# Patient Record
Sex: Female | Born: 1995 | Hispanic: No | Marital: Single | Smoking: Never smoker
Health system: Southern US, Community
[De-identification: ages and names within clinical notes are randomized; demographics above are authoritative.]

## PROBLEM LIST (undated history)

## (undated) HISTORY — PX: LIPOSUCTION: SHX10

---

## 2018-05-02 ENCOUNTER — Encounter (HOSPITAL_BASED_OUTPATIENT_CLINIC_OR_DEPARTMENT_OTHER): Payer: Self-pay | Admitting: Emergency Medicine

## 2018-05-02 ENCOUNTER — Emergency Department (HOSPITAL_BASED_OUTPATIENT_CLINIC_OR_DEPARTMENT_OTHER)
Admission: EM | Admit: 2018-05-02 | Discharge: 2018-05-02 | Disposition: A | Discharge status: Home / Self Care | Payer: PRIVATE HEALTH INSURANCE | Attending: Emergency Medicine | Admitting: Emergency Medicine

## 2018-05-02 ENCOUNTER — Emergency Department (HOSPITAL_BASED_OUTPATIENT_CLINIC_OR_DEPARTMENT_OTHER): Payer: PRIVATE HEALTH INSURANCE

## 2018-05-02 ENCOUNTER — Other Ambulatory Visit: Payer: Self-pay

## 2018-05-02 DIAGNOSIS — R05 Cough: Secondary | ICD-10-CM

## 2018-05-02 DIAGNOSIS — R059 Cough, unspecified: Secondary | ICD-10-CM

## 2018-05-02 DIAGNOSIS — J069 Acute upper respiratory infection, unspecified: Secondary | ICD-10-CM | POA: Diagnosis not present

## 2018-05-02 MED ORDER — BENZONATATE 100 MG PO CAPS: 100.0000 mg | ORAL_CAPSULE | Freq: Three times a day (TID) | ORAL | 0 refills | Status: AC

## 2018-05-02 NOTE — ED Triage Notes (Signed)
Pt having sour throat, persistent cough with greenish secretion for the past 5 days.

## 2018-05-02 NOTE — ED Provider Notes (Signed)
Emergency Department Provider Note   I have reviewed the triage vital signs and the nursing notes.   HISTORY  Chief Complaint URI   HPI Renee Valencia is a 23 y.o. female with PMH of soar throat and productive cough over the past 5 days.  Patient has not experienced fever.  No shaking chills.  No known exposure to COVID-19. No travel outside the immediate area or to high risk locations.  She denies any abdominal or chest pain.  She works in close proximity with the public.  A family member was evaluated today and reportedly diagnosed with a bronchitis.  Patient does not smoke cigarettes. No radiation or modifying factors.    History reviewed. No pertinent past medical history.  There are no active problems to display for this patient.   Past Surgical History:  Procedure Laterality Date  . LIPOSUCTION      Allergies Patient has no known allergies.  History reviewed. No pertinent family history.  Social History Social History   Tobacco Use  . Smoking status: Never Smoker  . Smokeless tobacco: Never Used  Substance Use Topics  . Alcohol use: Never    Frequency: Never  . Drug use: Never    Review of Systems  Constitutional: No fever/chills Eyes: No visual changes. ENT: Positive sore throat. Cardiovascular: Denies chest pain. Respiratory: Denies shortness of breath. Positive cough.  Gastrointestinal: No abdominal pain.  No nausea, no vomiting.  No diarrhea.  No constipation. Genitourinary: Negative for dysuria. Musculoskeletal: Negative for back pain. Skin: Negative for rash. Neurological: Negative for headaches, focal weakness or numbness.  10-point ROS otherwise negative.  ____________________________________________   PHYSICAL EXAM:  VITAL SIGNS: ED Triage Vitals [05/02/18 2149]  Enc Vitals Group     BP 122/74     Pulse Rate 90     Resp 18     Temp 98.5 F (36.9 C)     Temp Source Oral     SpO2 98 %   Constitutional: Alert and oriented. Well  appearing and in no acute distress. Eyes: Conjunctivae are normal. Head: Atraumatic. Nose: No congestion/rhinnorhea. Mouth/Throat: Mucous membranes are moist. No oropharyngeal erythema or exudate. No PTA.  Neck: No stridor.   Cardiovascular: Normal rate, regular rhythm. Good peripheral circulation. Grossly normal heart sounds.   Respiratory: Normal respiratory effort.  No retractions. Lungs CTAB. Gastrointestinal: Soft and nontender. No distention.  Musculoskeletal: No lower extremity tenderness nor edema. No gross deformities of extremities. Neurologic:  Normal speech and language. No gross focal neurologic deficits are appreciated.  Skin:  Skin is warm, dry and intact. No rash noted.  ____________________________________________  RADIOLOGY  Dg Chest 2 View  Result Date: 05/02/2018 CLINICAL DATA:  Cough EXAM: CHEST - 2 VIEW COMPARISON:  None. FINDINGS: The heart size and mediastinal contours are within normal limits. Both lungs are clear. Mild scoliosis of the spine IMPRESSION: No active cardiopulmonary disease. Electronically Signed   By: Jasmine Pang M.D.   On: 05/02/2018 22:34    ____________________________________________   PROCEDURES  Procedure(s) performed:   Procedures  None  ____________________________________________   INITIAL IMPRESSION / ASSESSMENT AND PLAN / ED COURSE  Pertinent labs & imaging results that were available during my care of the patient were reviewed by me and considered in my medical decision making (see chart for details).  Patient presents to the emergency department for evaluation of cough and sore throat.  No fever.  Low risk for COVID-19.  Would not offer test at this time given low  exposure risk.  Patient is well-appearing, afebrile, not hypoxic.  With 5 days of symptoms I do plan for chest x-ray and reassess.   CXR without acute findings. Plan for discharge with plan for supportive care and home isolation until symptoms resolve. Patient  understanding isolation instruction.  ____________________________________________  FINAL CLINICAL IMPRESSION(S) / ED DIAGNOSES  Final diagnoses:  Viral upper respiratory tract infection  Cough     NEW OUTPATIENT MEDICATIONS STARTED DURING THIS VISIT:  Discharge Medication List as of 05/02/2018 10:51 PM    START taking these medications   Details  benzonatate (TESSALON) 100 MG capsule Take 1 capsule (100 mg total) by mouth every 8 (eight) hours., Starting Tue 05/02/2018, Print        Note:  This document was prepared using Dragon voice recognition software and may include unintentional dictation errors.  Renee Bene, MD Emergency Medicine    Elda Dunkerson, Arlyss Repress, MD 05/03/18 [phone]

## 2018-05-02 NOTE — ED Notes (Signed)
ED Provider at bedside. 

## 2018-05-02 NOTE — ED Notes (Signed)
Patient transported to X-ray 

## 2018-05-02 NOTE — Discharge Instructions (Signed)

## 2018-10-03 ENCOUNTER — Encounter (HOSPITAL_BASED_OUTPATIENT_CLINIC_OR_DEPARTMENT_OTHER): Payer: Self-pay

## 2018-10-03 ENCOUNTER — Emergency Department (HOSPITAL_BASED_OUTPATIENT_CLINIC_OR_DEPARTMENT_OTHER): Payer: PRIVATE HEALTH INSURANCE

## 2018-10-03 ENCOUNTER — Other Ambulatory Visit: Payer: Self-pay

## 2018-10-03 ENCOUNTER — Emergency Department (HOSPITAL_BASED_OUTPATIENT_CLINIC_OR_DEPARTMENT_OTHER)
Admission: EM | Admit: 2018-10-03 | Discharge: 2018-10-03 | Disposition: A | Discharge status: Home / Self Care | Payer: PRIVATE HEALTH INSURANCE | Attending: Emergency Medicine | Admitting: Emergency Medicine

## 2018-10-03 DIAGNOSIS — R1013 Epigastric pain: Secondary | ICD-10-CM | POA: Insufficient documentation

## 2018-10-03 DIAGNOSIS — R1011 Right upper quadrant pain: Secondary | ICD-10-CM | POA: Insufficient documentation

## 2018-10-03 LAB — COMPREHENSIVE METABOLIC PANEL
ALT: 15 U/L (ref 0–44)
AST: 17 U/L (ref 15–41)
Albumin: 4.5 g/dL (ref 3.5–5.0)
Alkaline Phosphatase: 72 U/L (ref 38–126)
Anion gap: 9 (ref 5–15)
BUN: 17 mg/dL (ref 6–20)
CO2: 27 mmol/L (ref 22–32)
Calcium: 9.4 mg/dL (ref 8.9–10.3)
Chloride: 104 mmol/L (ref 98–111)
Creatinine, Ser: 0.69 mg/dL (ref 0.44–1.00)
GFR calc Af Amer: >60 mL/min (ref >60)
GFR calc non Af Amer: >60 mL/min (ref >60)
Glucose, Bld: 81 mg/dL (ref 70–99)
Potassium: 3.6 mmol/L (ref 3.5–5.1)
Sodium: 140 mmol/L (ref 135–145)
Total Bilirubin: 0.4 mg/dL (ref 0.3–1.2)
Total Protein: 7.8 g/dL (ref 6.5–8.1)

## 2018-10-03 LAB — URINALYSIS, ROUTINE W REFLEX MICROSCOPIC
Bilirubin Urine: NEGATIVE
Glucose, UA: NEGATIVE mg/dL
Hgb urine dipstick: NEGATIVE
Ketones, ur: NEGATIVE mg/dL
Leukocytes,Ua: NEGATIVE
Nitrite: NEGATIVE
Protein, ur: NEGATIVE mg/dL
Specific Gravity, Urine: 1.025 (ref 1.005–1.030)
pH: 6 (ref 5.0–8.0)

## 2018-10-03 LAB — CBC
HCT: 40.5 % (ref 36.0–46.0)
Hemoglobin: 12.8 g/dL (ref 12.0–15.0)
MCH: 27.4 pg (ref 26.0–34.0)
MCHC: 31.6 g/dL (ref 30.0–36.0)
MCV: 86.7 fL (ref 80.0–100.0)
Platelets: 292 10*3/uL (ref 150–400)
RBC: 4.67 MIL/uL (ref 3.87–5.11)
RDW: 14.4 % (ref 11.5–15.5)
WBC: 9.9 10*3/uL (ref 4.0–10.5)
nRBC: 0 % (ref 0.0–0.2)

## 2018-10-03 LAB — PREGNANCY, URINE: Preg Test, Ur: NEGATIVE

## 2018-10-03 LAB — LIPASE, BLOOD: Lipase: 34 U/L (ref 11–51)

## 2018-10-03 MED ORDER — SODIUM CHLORIDE 0.9% FLUSH
3.0000 mL | Freq: Once | INTRAVENOUS | Status: DC
  Filled 2018-10-03: qty 3

## 2018-10-03 MED ORDER — OMEPRAZOLE 20 MG PO CPDR: 20.0000 mg | DELAYED_RELEASE_CAPSULE | Freq: Every day | ORAL | 0 refills | Status: AC

## 2018-10-03 NOTE — Discharge Instructions (Addendum)
You were seen in the emergency department for upper abdominal pain.  You had blood work and a right upper quadrant ultrasound that were unremarkable.  This is possibly related to some gastritis or an ulcer and we are starting you on some acid medication.  Please limit your coffee drinking and you can try Maalox in between meals and at bedtime.  Follow-up with your doctor or return if any worsening symptoms.

## 2018-10-03 NOTE — ED Provider Notes (Signed)
[facility] EMERGENCY DEPARTMENT Provider Note   CSN: [id number] Arrival date & time: 10/03/18  1429     History   Chief Complaint Chief Complaint  Patient presents with  . Abdominal Pain    HPI Renee Valencia is a 23 y.o. female.  She is complaining of 4 days of subxiphoid abdominal pain.  She seems to be worse with eating and improved with bowel rest.  Not associate with any nausea vomiting diarrhea or urinary symptoms.  She took some Tylenol with minimal relief.  She does not smoke she drinks only socially and does not take a lot of NSAIDs although she does drink a lot of coffee.  She said 8 years ago she was told she has an ulcer or gastritis.  History gathered via iPad interpreter Spanish-speaking.     The history is provided by the patient. The history is limited by a language barrier. A language interpreter was used.  Abdominal Pain Pain location:  Epigastric Pain quality: cramping   Pain radiates to:  Does not radiate Pain severity:  Moderate Onset quality:  Gradual Duration:  4 days Timing:  Intermittent Progression:  Worsening Chronicity:  New Context: eating   Context: not alcohol use, not diet changes, not recent illness, not suspicious food intake and not trauma   Relieved by:  Nothing Worsened by:  Eating Ineffective treatments:  Acetaminophen Associated symptoms: no chest pain, no chills, no constipation, no cough, no diarrhea, no dysuria, no fever, no hematemesis, no hematochezia, no hematuria, no nausea, no shortness of breath, no sore throat, no vaginal bleeding and no vomiting   Risk factors: no alcohol abuse, has not had multiple surgeries, no NSAID use and not pregnant     History reviewed. No pertinent past medical history.  There are no active problems to display for this patient.   Past Surgical History:  Procedure Laterality Date  . LIPOSUCTION    . LIPOSUCTION       OB History   No obstetric history on file.      Home  Medications    Prior to Admission medications   Medication Sig Start Date End Date Taking? Authorizing Provider  benzonatate (TESSALON) 100 MG capsule Take 1 capsule (100 mg total) by mouth every 8 (eight) hours. 05/02/18   Long, Arlyss RepressJoshua G, MD    Family History No family history on file.  Social History Social History   Tobacco Use  . Smoking status: Never Smoker  . Smokeless tobacco: Never Used  Substance Use Topics  . Alcohol use: Yes    Frequency: Never    Comment: occ  . Drug use: Never     Allergies   Patient has no known allergies.   Review of Systems Review of Systems  Constitutional: Negative for chills and fever.  HENT: Negative for sore throat.   Eyes: Negative for visual disturbance.  Respiratory: Negative for cough and shortness of breath.   Cardiovascular: Negative for chest pain.  Gastrointestinal: Positive for abdominal pain. Negative for constipation, diarrhea, hematemesis, hematochezia, nausea and vomiting.  Genitourinary: Negative for dysuria, hematuria and vaginal bleeding.  Musculoskeletal: Negative for back pain.  Skin: Negative for rash.  Neurological: Negative for headaches.     Physical Exam Updated Vital Signs BP 106/82 (BP Location: Left Arm)   Pulse 77   Temp 99.3 F (37.4 C) (Oral)   Resp 20   Ht 5\' 4"  (1.626 m)   Wt 56.2 kg   LMP 09/12/2018   SpO2 100%  BMI 21.28 kg/m   Physical Exam Vitals signs and nursing note reviewed.  Constitutional:      General: She is not in acute distress.    Appearance: She is well-developed.  HENT:     Head: Normocephalic and atraumatic.  Eyes:     Conjunctiva/sclera: Conjunctivae normal.  Neck:     Musculoskeletal: Neck supple.  Cardiovascular:     Rate and Rhythm: Normal rate and regular rhythm.     Heart sounds: No murmur.  Pulmonary:     Effort: Pulmonary effort is normal. No respiratory distress.     Breath sounds: Normal breath sounds.  Abdominal:     Palpations: Abdomen is soft.      Tenderness: There is abdominal tenderness in the epigastric area. There is no guarding or rebound.  Musculoskeletal: Normal range of motion.     Right lower leg: No edema.     Left lower leg: No edema.  Skin:    General: Skin is warm and dry.     Capillary Refill: Capillary refill takes less than 2 seconds.  Neurological:     General: No focal deficit present.     Mental Status: She is alert and oriented to person, place, and time.      ED Treatments / Results  Labs (all labs ordered are listed, but only abnormal results are displayed) Labs Reviewed  LIPASE, BLOOD  COMPREHENSIVE METABOLIC PANEL  CBC  URINALYSIS, ROUTINE W REFLEX MICROSCOPIC  PREGNANCY, URINE    EKG None  Radiology US Abdomen Limited Ruq  Result Date: 10/03/2018 CLINICAL DATA:  Epigastric pain for 1 day. EXAM: ULTRASOUND ABDOMEN LIMITED RIGHT UPPER QUADRANT COMPARISON:  None. FINDINGS: Gallbladder: No gallstones or wall thickening visualized. No sonographic Murphy sign noted by sonographer. Common bile duct: Diameter: 2 mm Liver: No focal lesion identified. Within normal limits in parenchymal echogenicity. Portal vein is patent on color Doppler imaging with normal direction of blood flow towards the liver. Other: None. IMPRESSION: Normal RIGHT upper quadrant ultrasound. Electronically Signed   By: Franki Cabot M.D.   On: 10/03/2018 18:35    Procedures Procedures (including critical care time)  Medications Ordered in ED Medications  sodium chloride flush (NS) 0.9 % injection 3 mL (has no administration in time range)     Initial Impression / Assessment and Plan / ED Course  I have reviewed the triage vital signs and the nursing notes.  Pertinent labs & imaging results that were available during my care of the patient were reviewed by me and considered in my medical decision making (see chart for details).  Clinical Course as of Oct 04 907  Tue Oct 03, 5119  5313 23 year old healthy female here  with 4 days of intermittent worsening subxiphoid abdominal pain.  Related to food.  Differential includes gastritis, peptic ulcer disease, cholelithiasis, cholecystitis.  Her lab work including LFTs is unremarkable.  Pregnancy test negative.  She has a benign exam although she does have some xiphoid tenderness is very mild on palpation.  Put her in for right upper quadrant ultrasound.  If this is negative would start her on a PPI.   [MB]  3016 Patient's right upper quadrant ultrasound did not show any obvious stones or gallbladder thickening.   [MB]  1850   Will send home on some PPI treatment.   [MB]    Clinical Course User Index [MB] Hayden Rasmussen, MD       Final Clinical Impressions(s) / ED Diagnoses   Final diagnoses:  RUQ abdominal pain    ED Discharge Orders         Ordered    omeprazole (PRILOSEC) 20 MG capsule  Daily     10/03/18 1850           Terrilee FilesButler, Shyasia Funches C, MD 10/04/18 [phone]

## 2018-10-03 NOTE — ED Notes (Signed)
ED Provider at bedside. 

## 2018-10-03 NOTE — ED Triage Notes (Signed)
Pt c/o mid abd pain x 4 days-denies n/v/d-NAD-steady gait

## 2018-11-25 ENCOUNTER — Encounter (HOSPITAL_COMMUNITY): Payer: Self-pay

## 2018-11-25 ENCOUNTER — Ambulatory Visit (HOSPITAL_COMMUNITY)
Admission: EM | Admit: 2018-11-25 | Discharge: 2018-11-25 | Disposition: A | Discharge status: Home / Self Care | Payer: PRIVATE HEALTH INSURANCE | Source: Home / Self Care

## 2018-11-25 ENCOUNTER — Other Ambulatory Visit: Payer: Self-pay

## 2018-11-25 ENCOUNTER — Emergency Department (HOSPITAL_COMMUNITY)
Admission: EM | Admit: 2018-11-25 | Discharge: 2018-11-25 | Disposition: A | Discharge status: Home / Self Care | Payer: PRIVATE HEALTH INSURANCE | Attending: Emergency Medicine | Admitting: Emergency Medicine

## 2018-11-25 DIAGNOSIS — Z20828 Contact with and (suspected) exposure to other viral communicable diseases: Secondary | ICD-10-CM | POA: Insufficient documentation

## 2018-11-25 DIAGNOSIS — Z79899 Other long term (current) drug therapy: Secondary | ICD-10-CM | POA: Insufficient documentation

## 2018-11-25 DIAGNOSIS — Z20822 Contact with and (suspected) exposure to covid-19: Secondary | ICD-10-CM

## 2018-11-25 NOTE — ED Triage Notes (Signed)
Pt's mother was tested positive for Covid & she is concerned that she has it now. Pt also states that she is supposed to be traveling to [place] to be with family & she [place]

## 2018-11-25 NOTE — ED Provider Notes (Signed)
[facility] EMERGENCY DEPARTMENT Provider Note   CSN: [id number] Arrival date & time: 11/25/18  1348    History   Chief Complaint COVID exposure  HPI Renee Valencia is a 23 y.o. female with no significant past medical history who presents for evaluation of COVID exposure.  Patient states her mother recently returned from [place] and tested positive for COVID 4 days ago.  Patient denies any current symptoms.  She denies fever, headache, loss of taste or smell, congestion, rhinorrhea, cough, sore throat, abdominal pain, diarrhea or dysuria.  Patient states she supposed to travel to [place] next week on airplane needs negative test before then.  She is been tolerating p.o. intake at home without difficulty.  Patient presented to urgent care for rapid COVID test and left at that time due to them not providing rapid testing.  Discussed with patient hospitals policy for rapid COVID testing.  She does not fit this criteria for rapid COVID.  Offered our outpatient testing which returns in 3 to 4 days.  Patient is agreeable to for this testing at this time.  History obtained from patient and past medical records.  No interpreter was used.     HPI  History reviewed. No pertinent past medical history.  There are no active problems to display for this patient.   Past Surgical History:  Procedure Laterality Date  . LIPOSUCTION    . LIPOSUCTION       OB History   No obstetric history on file.      Home Medications    Prior to Admission medications   Medication Sig Start Date End Date Taking? Authorizing Provider  benzonatate (TESSALON) 100 MG capsule Take 1 capsule (100 mg total) by mouth every 8 (eight) hours. 05/02/18   Long, Arlyss Repress, MD  omeprazole (PRILOSEC) 20 MG capsule Take 1 capsule (20 mg total) by mouth daily. 10/03/18   Terrilee Files, MD    Family History History reviewed. No pertinent family history.  Social History Social History   Tobacco Use   . Smoking status: Never Smoker  . Smokeless tobacco: Never Used  Substance Use Topics  . Alcohol use: Yes    Frequency: Never    Comment: occ  . Drug use: Never     Allergies   Patient has no known allergies.   Review of Systems Review of Systems  Constitutional: Negative.   HENT: Negative.   Eyes: Negative.   Respiratory: Negative.   Cardiovascular: Negative.   Gastrointestinal: Negative.   Genitourinary: Negative.   Musculoskeletal: Negative.   Skin: Negative.   Neurological: Negative.   All other systems reviewed and are negative.    Physical Exam Updated Vital Signs BP 105/73   Pulse 78   Temp 98.4 F (36.9 C) (Oral)   Resp 16   SpO2 98%   Physical Exam Vitals signs and nursing note reviewed.  Constitutional:      General: She is not in acute distress.    Appearance: She is well-developed. She is not ill-appearing or toxic-appearing.  HENT:     Head: Normocephalic and atraumatic.     Nose: Nose normal.     Mouth/Throat:     Mouth: Mucous membranes are moist.     Pharynx: Oropharynx is clear.  Eyes:     Pupils: Pupils are equal, round, and reactive to light.  Neck:     Musculoskeletal: Normal range of motion.  Cardiovascular:     Rate and Rhythm: Normal rate.  Pulses: Normal pulses.     Heart sounds: Normal heart sounds.  Pulmonary:     Effort: Pulmonary effort is normal. No respiratory distress.     Breath sounds: Normal breath sounds.     Comments: Speaks in full sentences without difficulty.  No accessory muscle usage. Abdominal:     General: There is no distension.     Comments: Soft, nontender.  Musculoskeletal: Normal range of motion.     Comments: Moves all 4 extremities without difficulty.  Skin:    General: Skin is warm and dry.     Comments: Brisk capillary refill.  No rashes or lesions.  Neurological:     Mental Status: She is alert.     Comments: Cranial nerves II through XII grossly intact.  Normal phonation.  Ambulatory that  difficulty.      ED Treatments / Results  Labs (all labs ordered are listed, but only abnormal results are displayed) Labs Reviewed  NOVEL CORONAVIRUS, NAA (HOSP ORDER, SEND-OUT TO REF LAB; TAT 18-24 HRS)    EKG None  Radiology No results found.  Procedures Procedures (including critical care time)  Medications Ordered in ED Medications - No data to display   Initial Impression / Assessment and Plan / ED Course  I have reviewed the triage vital signs and the nursing notes.  Pertinent labs & imaging results that were available during my care of the patient were reviewed by me and considered in my medical decision making (see chart for details).   23 year old female appears otherwise well presents for evaluation of needing COVID test.  She is asymptomatic.  Known exposure in her mother who is positive whom she lives with.  She is afebrile without tachycardia, tachypnea or hypoxia.  She does not appear septic or ill.  Her heart and lungs are clear.  She is tolerating p.o. intake and has normal gait.  Will do outpatient COVID test.  Patient is to self isolate at home until her test has resulted.  If positive she will need to self isolate for 10 days.       Renee Valencia was evaluated in Emergency Department on 11/25/2018 for the symptoms described in the history of present illness. She was evaluated in the context of the global COVID-19 pandemic, which necessitated consideration that the patient might be at risk for infection with the SARS-CoV-2 virus that causes COVID-19. Institutional protocols and algorithms that pertain to the evaluation of patients at risk for COVID-19 are in a state of rapid change based on information released by regulatory bodies including the CDC and federal and state organizations. These policies and algorithms were followed during the patient's care in the ED. Final Clinical Impressions(s) / ED Diagnoses   Final diagnoses:  Close exposure to COVID-19 virus     ED Discharge Orders    None       Jamorian Dimaria A, PA-C 11/25/18 1459    Charlesetta Shanks, MD 11/26/18 1112

## 2018-11-25 NOTE — ED Notes (Signed)
Pt presented to the [facility] with requests for rapid COVID-19 testing, which this facility does not provide. Patient left.

## 2018-11-26 LAB — NOVEL CORONAVIRUS, NAA (HOSP ORDER, SEND-OUT TO REF LAB; TAT 18-24 HRS): SARS-CoV-2, NAA: NOT DETECTED

## 2019-05-12 ENCOUNTER — Ambulatory Visit: Payer: PRIVATE HEALTH INSURANCE

## 2019-05-14 ENCOUNTER — Ambulatory Visit: Payer: Self-pay | Attending: Internal Medicine

## 2019-05-14 ENCOUNTER — Other Ambulatory Visit: Payer: Self-pay

## 2019-05-14 DIAGNOSIS — Z23 Encounter for immunization: Secondary | ICD-10-CM

## 2019-05-14 NOTE — Progress Notes (Signed)
   Covid-19 Vaccination Clinic  Name:  Whittney Steenson    MRN: [id number] DOB: 01/28/1996  05/14/2019  Ms. Klumb was observed post Covid-19 immunization for 15 minutes without incident. She was provided with Vaccine Information Sheet and instruction to access the V-Safe system.   Ms. Varon was instructed to call 911 with any severe reactions post vaccine: Marland Kitchen Difficulty breathing  . Swelling of face and throat  . A fast heartbeat  . A bad rash all over body  . Dizziness and weakness   Immunizations Administered    Name Date Dose VIS Date Route   Pfizer COVID-19 Vaccine 05/14/2019 12:09 PM 0.3 mL 01/26/2019 Intramuscular   Manufacturer: [employer], [employer]   Lot: [id number]   NDC: [address]-7

## 2019-06-04 ENCOUNTER — Ambulatory Visit: Payer: Self-pay

## 2019-06-11 ENCOUNTER — Ambulatory Visit: Payer: Self-pay | Attending: Internal Medicine

## 2019-06-11 DIAGNOSIS — Z23 Encounter for immunization: Secondary | ICD-10-CM

## 2019-06-11 NOTE — Progress Notes (Signed)
   Covid-19 Vaccination Clinic  Name:  Renee Valencia    MRN: [id number] DOB: [birth year]  06/11/2019  [name] was observed post Covid-19 immunization for 15 minutes without incident. She was provided with Vaccine Information Sheet and instruction to access the V-Safe system.   [name] was instructed to call 911 with any severe reactions post vaccine: Marland Kitchen Difficulty breathing  . Swelling of face and throat  . A fast heartbeat  . A bad rash all over body  . Dizziness and weakness   Immunizations Administered    Name Date Dose VIS Date Route   Pfizer COVID-19 Vaccine 06/11/2019  2:43 PM 0.3 mL 04/11/2018 Intramuscular   Manufacturer: [employer], [employer]   Lot: [id number]   NDC: [address]-8

## 2020-03-06 ENCOUNTER — Encounter: Payer: Self-pay | Admitting: Student

## 2021-03-30 IMAGING — US ULTRASOUND ABDOMEN LIMITED
1 series · 14 of 25 positions shown · non-contrast
Comparison: None.

CLINICAL DATA: Epigastric pain for 1 day.

EXAM:
ULTRASOUND ABDOMEN LIMITED RIGHT UPPER QUADRANT

[Series 1: ultrasound abdomen limited · 14 of 49 slices shown]
[im 1/49]
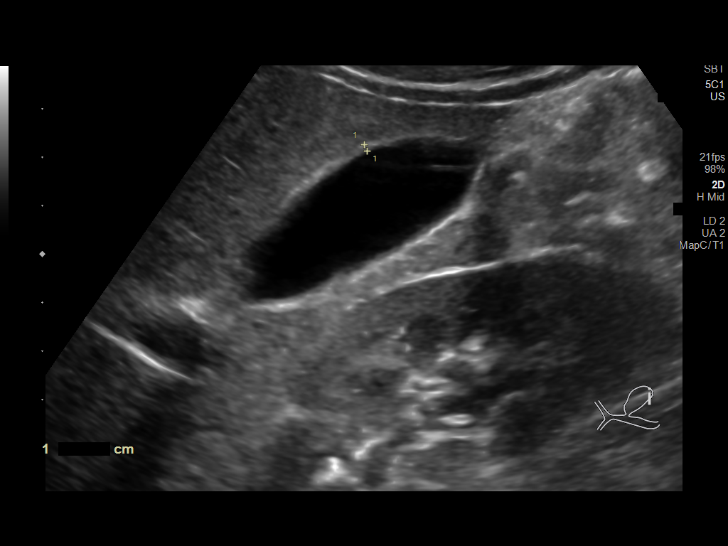
[im 5/49]
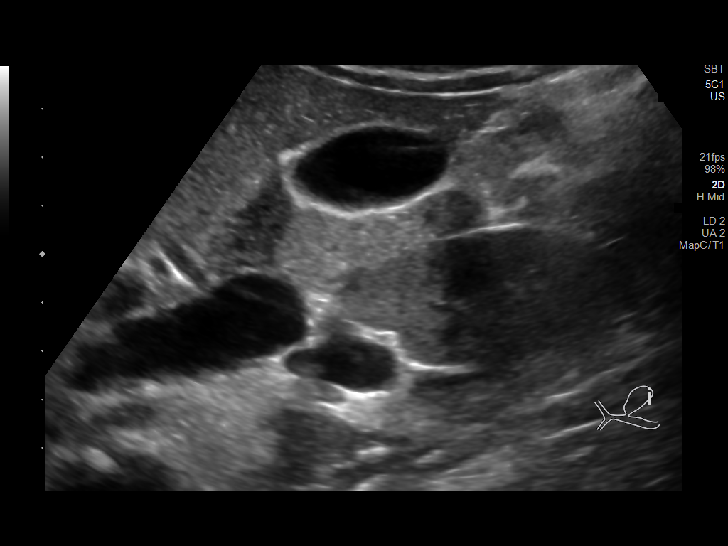
[im 9/49]
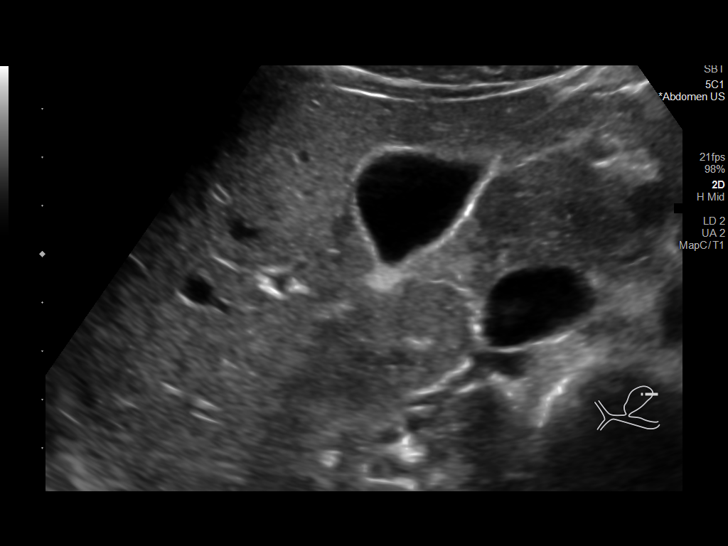
[im 13/49]
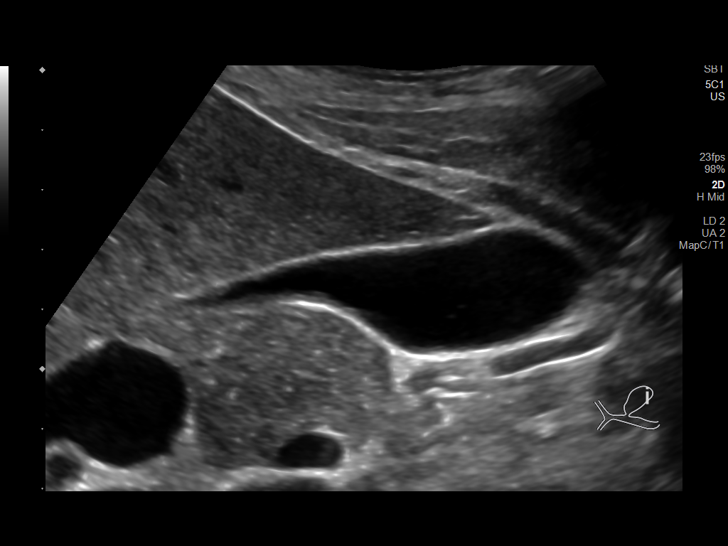
[im 17/49]
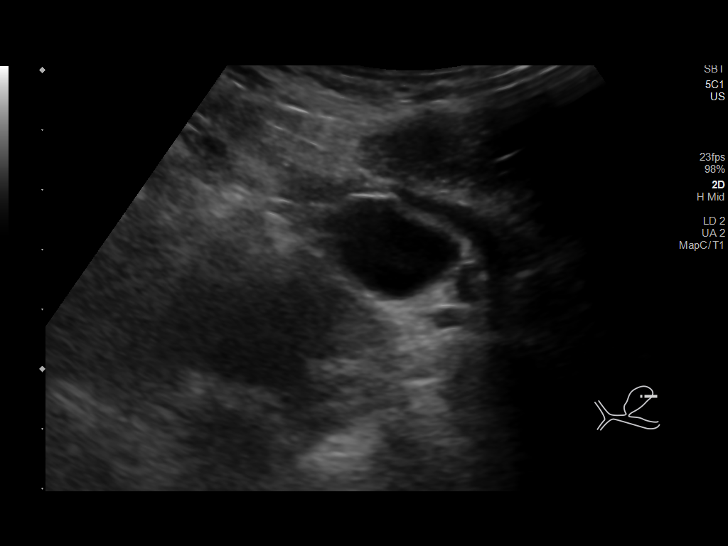
[im 19/49]
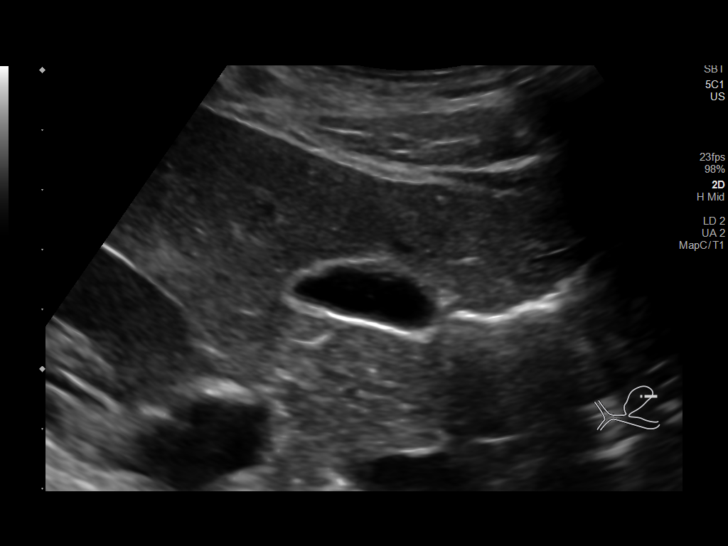
[im 23/49]
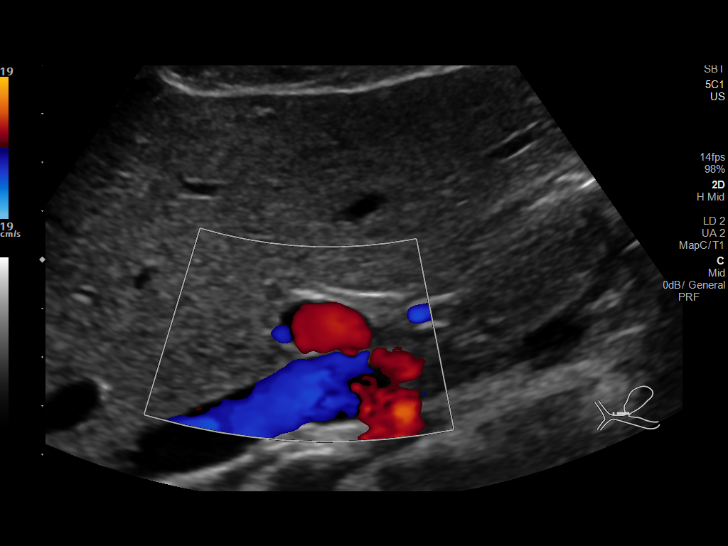
[im 27/49]
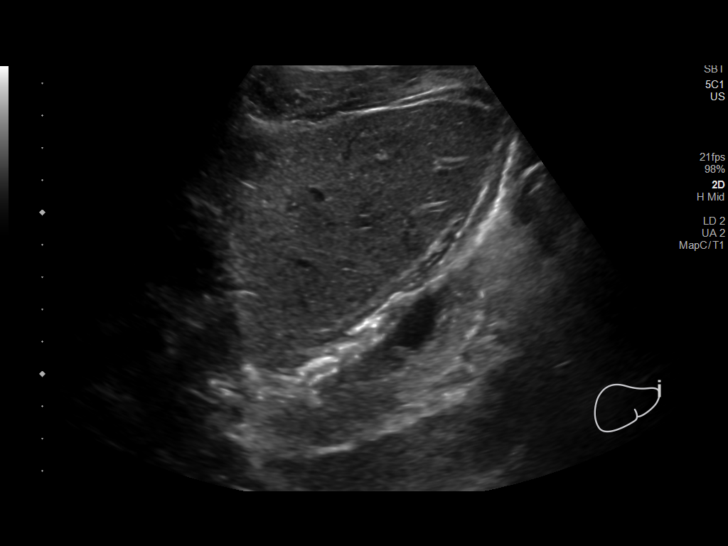
[im 31/49]
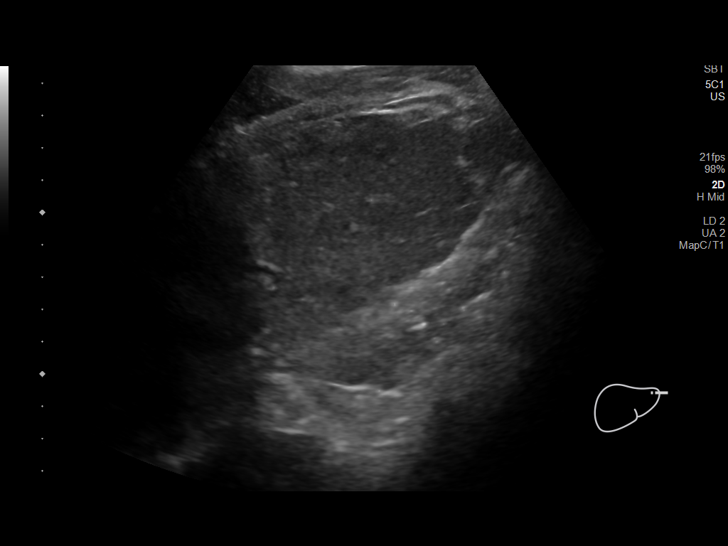
[im 33/49]
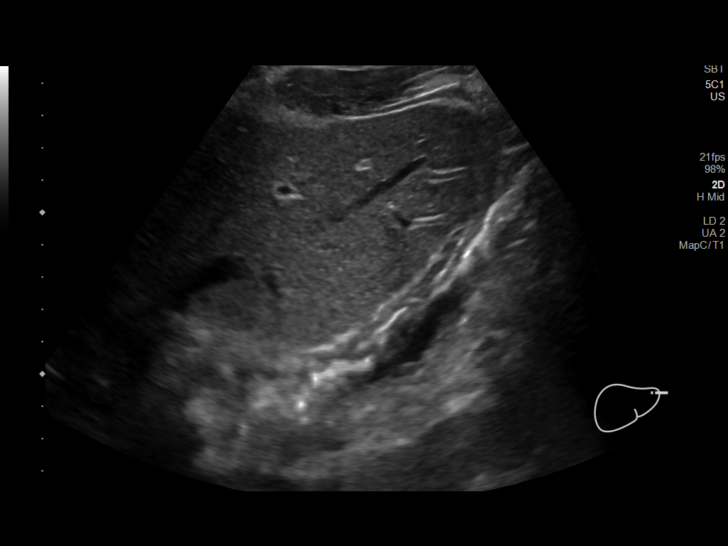
[im 37/49]
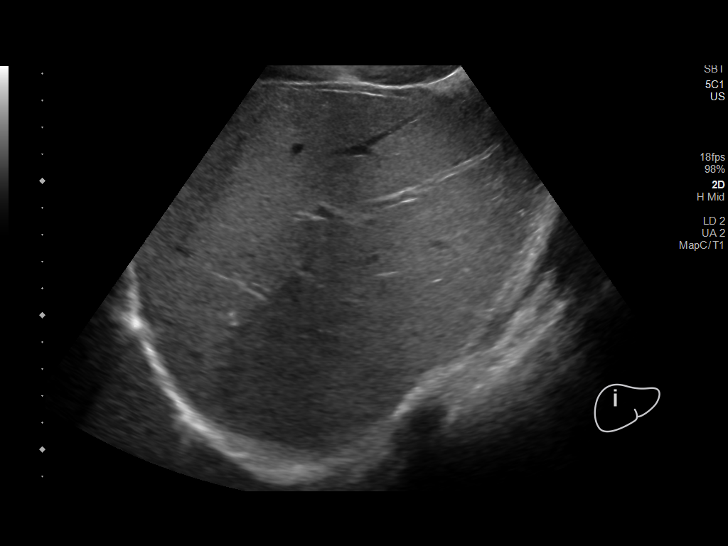
[im 41/49]
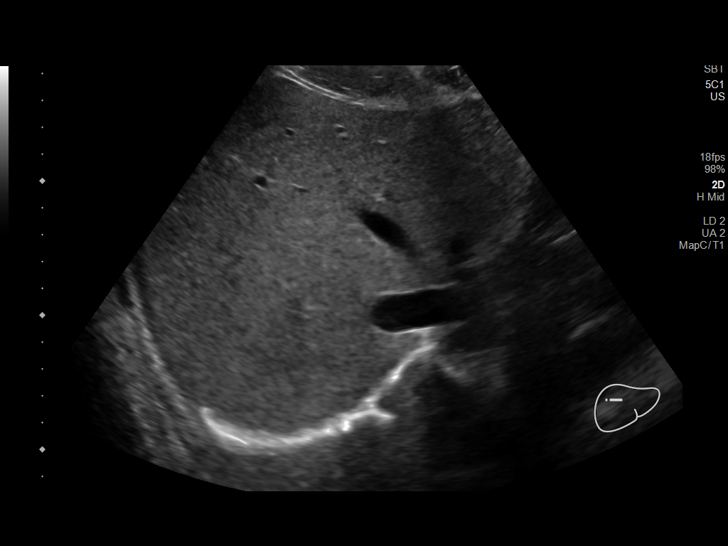
[im 45/49]
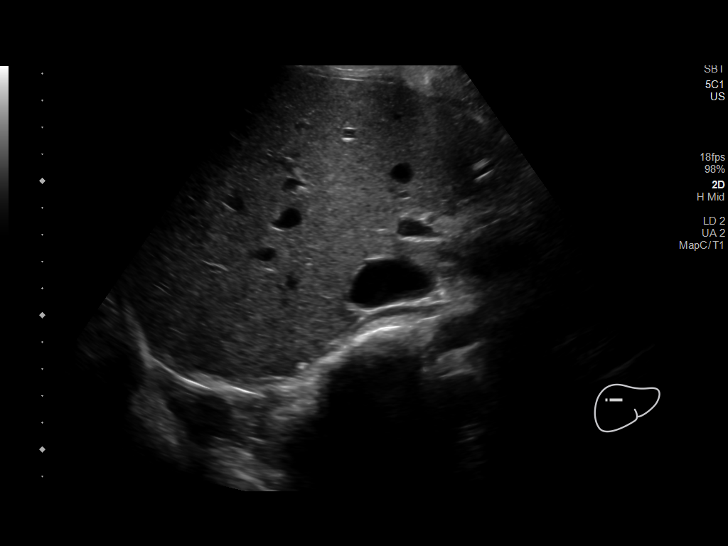
[im 49/49]
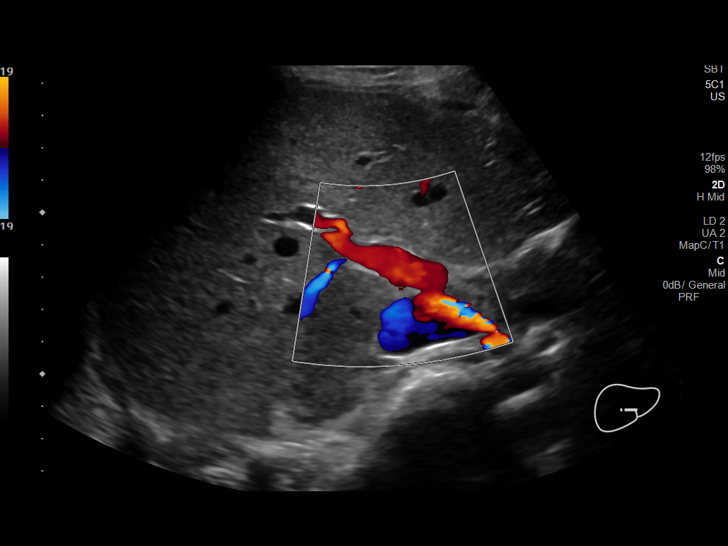

[14 of 25 positions shown; findings below may reference images not displayed]

FINDINGS: Gallbladder:

No gallstones or wall thickening visualized. No sonographic Murphy
sign noted by sonographer.

Common bile duct:

Diameter: 2 mm

Liver:

No focal lesion identified. Within normal limits in parenchymal
echogenicity. Portal vein is patent on color Doppler imaging with
normal direction of blood flow towards the liver.

Other: None.
IMPRESSION: Normal RIGHT upper quadrant ultrasound.
